# Patient Record
Sex: Female | Born: 2000 | Race: Asian | Hispanic: No | Marital: Single | Smoking: Never smoker
Health system: Southern US, Community
[De-identification: ages and names within clinical notes are randomized; demographics above are authoritative.]

## PROBLEM LIST (undated history)

## (undated) DIAGNOSIS — F419 Anxiety disorder, unspecified: Secondary | ICD-10-CM

## (undated) DIAGNOSIS — F429 Obsessive-compulsive disorder, unspecified: Secondary | ICD-10-CM

---
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
## 2021-09-18 ENCOUNTER — Emergency Department (HOSPITAL_BASED_OUTPATIENT_CLINIC_OR_DEPARTMENT_OTHER)
Admission: EM | Admit: 2021-09-18 | Discharge: 2021-09-18 | Disposition: A | Discharge status: Home / Self Care | Payer: Medicaid Other | Attending: Emergency Medicine | Admitting: Emergency Medicine

## 2021-09-18 ENCOUNTER — Encounter (HOSPITAL_BASED_OUTPATIENT_CLINIC_OR_DEPARTMENT_OTHER): Payer: Self-pay | Admitting: Emergency Medicine

## 2021-09-18 DIAGNOSIS — F840 Autistic disorder: Secondary | ICD-10-CM | POA: Insufficient documentation

## 2021-09-18 DIAGNOSIS — K625 Hemorrhage of anus and rectum: Secondary | ICD-10-CM | POA: Diagnosis present

## 2021-09-18 DIAGNOSIS — K640 First degree hemorrhoids: Secondary | ICD-10-CM | POA: Insufficient documentation

## 2021-09-18 HISTORY — DX: Obsessive-compulsive disorder, unspecified: F42.9

## 2021-09-18 HISTORY — DX: Anxiety disorder, unspecified: F41.9

## 2021-09-18 LAB — COMPREHENSIVE METABOLIC PANEL
ALT: 14 U/L (ref 0–44)
AST: 21 U/L (ref 15–41)
Albumin: 4.4 g/dL (ref 3.5–5.0)
Alkaline Phosphatase: 75 U/L (ref 38–126)
Anion gap: 8 (ref 5–15)
BUN: 14 mg/dL (ref 6–20)
CO2: 25 mmol/L (ref 22–32)
Calcium: 9.8 mg/dL (ref 8.9–10.3)
Chloride: 105 mmol/L (ref 98–111)
Creatinine, Ser: 0.68 mg/dL (ref 0.44–1.00)
GFR, Estimated: >60 mL/min (ref >60)
Glucose, Bld: 85 mg/dL (ref 70–99)
Potassium: 3.9 mmol/L (ref 3.5–5.1)
Sodium: 138 mmol/L (ref 135–145)
Total Bilirubin: 0.7 mg/dL (ref 0.3–1.2)
Total Protein: 8.2 g/dL — ABNORMAL HIGH (ref 6.5–8.1)

## 2021-09-18 LAB — OCCULT BLOOD X 1 CARD TO LAB, STOOL: Fecal Occult Bld: POSITIVE — AB

## 2021-09-18 LAB — CBC WITH DIFFERENTIAL/PLATELET
Abs Immature Granulocytes: 0.03 10*3/uL (ref 0.00–0.07)
Basophils Absolute: 0.1 10*3/uL (ref 0.0–0.1)
Basophils Relative: 0 %
Eosinophils Absolute: 0.1 10*3/uL (ref 0.0–0.5)
Eosinophils Relative: 1 %
HCT: 41.5 % (ref 36.0–46.0)
Hemoglobin: 13.9 g/dL (ref 12.0–15.0)
Immature Granulocytes: 0 %
Lymphocytes Relative: 30 %
Lymphs Abs: 3.5 10*3/uL (ref 0.7–4.0)
MCH: 29.4 pg (ref 26.0–34.0)
MCHC: 33.5 g/dL (ref 30.0–36.0)
MCV: 87.7 fL (ref 80.0–100.0)
Monocytes Absolute: 0.9 10*3/uL (ref 0.1–1.0)
Monocytes Relative: 8 %
Neutro Abs: 7.1 10*3/uL (ref 1.7–7.7)
Neutrophils Relative %: 61 %
Platelets: 230 10*3/uL (ref 150–400)
RBC: 4.73 MIL/uL (ref 3.87–5.11)
RDW: 12 % (ref 11.5–15.5)
WBC: 11.7 10*3/uL — ABNORMAL HIGH (ref 4.0–10.5)
nRBC: 0 % (ref 0.0–0.2)

## 2021-09-18 MED ORDER — LORAZEPAM 2 MG/ML IJ SOLN
1.0000 mg | Freq: Once | INTRAMUSCULAR | Status: AC
  Administered 2021-09-18: 1 mg via INTRAVENOUS
  Filled 2021-09-18: qty 1

## 2021-09-18 MED ORDER — HYDROCORTISONE (PERIANAL) 2.5 % EX CREA: 1.0000 | TOPICAL_CREAM | Freq: Two times a day (BID) | CUTANEOUS | 0 refills | Status: AC

## 2021-09-18 MED ORDER — SERTRALINE HCL 50 MG PO TABS: 150.0000 mg | ORAL_TABLET | Freq: Every day | ORAL | 0 refills | Status: AC

## 2021-09-18 NOTE — ED Notes (Signed)
ED Provider at bedside. 

## 2021-09-18 NOTE — ED Triage Notes (Signed)
Mom reports pt has been sticking her finger up her anus x 2 months. Mom states when patient pulls her finger out there is blood and feces. Mom unsure why patient is doing this. On sertraline and gabapentin for anxiety and OCD.

## 2021-09-18 NOTE — Discharge Instructions (Addendum)
You have hemorrhoids and I recommend you use Anusol cream twice daily.  You are expected to still have some bleeding  I have increased your sertraline to 150 mg daily.  You need to keep your appointment with psychiatry as scheduled in July  Return to ER if you have worse agitation, aggressive behavior, uncontrolled bleeding

## 2021-09-18 NOTE — ED Provider Notes (Signed)
[facility] EMERGENCY DEPARTMENT Provider Note   CSN: [id number] Arrival date & time: 09/18/21  1530     History  Chief Complaint  Patient presents with   Rectal Bleeding   Mental Health Problem    Mckenzie Terry is a 21 y.o. female history of OCD and autism here presenting with rectal bleeding and agitation.  Patient is from home and accompanied by her brother and also mother.  Mother states that patient has OCD and is constantly sticking her finger in her rectum.  Patient usually is in the bathroom for hours at a time.  Mother noticed that recently she has a lot of rectal bleeding.  Patient has been following up with psychiatry.  Patient was seen in the ED several months ago for similar symptoms and started on the Abilify.  Patient then saw psychiatry and switched to clonidine, gabapentin, sertraline.  Her sertraline dose was increased to 100 mg from 50 mg about a month ago and the behavior has not improved.  Patient has psychiatry follow-up in 2 weeks.  Since she has more bleeding in her rectum since yesterday, mother wants to get her medically evaluated.  The history is provided by the patient.       Home Medications Prior to Admission medications   Medication Sig Start Date End Date Taking? Authorizing Provider  ARIPiprazole (ABILIFY) 5 MG tablet Take 1 tablet (5 mg total) by mouth in the morning and at bedtime. 10/18/20 11/17/20  Linwood Dibbles, MD  fluvoxaMINE (LUVOX) 50 MG tablet Take 50 mg by mouth at bedtime.    [provider]  MELATONIN PO Take 1 tablet by mouth as needed (for sleep).    [provider]  Multiple Vitamin (MULTIVITAMIN PO) Take 1 tablet by mouth daily.    [provider]      Allergies    Patient has no known allergies.    Review of Systems   Review of Systems  Gastrointestinal:  Positive for blood in stool.  All other systems reviewed and are negative.   Physical Exam Updated Vital Signs BP 109/77   Pulse 95    Temp 98.6 F (37 C) (Oral)   Resp 18   SpO2 94%  Physical Exam Vitals and nursing note reviewed.  Constitutional:      Comments: Autistic  HENT:     Head: Normocephalic.     Nose: Nose normal.     Mouth/Throat:     Mouth: Mucous membranes are moist.  Eyes:     Extraocular Movements: Extraocular movements intact.     Pupils: Pupils are equal, round, and reactive to light.  Cardiovascular:     Rate and Rhythm: Normal rate and regular rhythm.     Pulses: Normal pulses.     Heart sounds: Normal heart sounds.  Pulmonary:     Effort: Pulmonary effort is normal.     Breath sounds: Normal breath sounds.  Abdominal:     General: Abdomen is flat.     Palpations: Abdomen is soft.  Genitourinary:    Comments: Rectal-multiple external hemorrhoids.  No obvious thrombosis.  Patient has minimal blood on the rectal exam and no stool impaction Musculoskeletal:        General: Normal range of motion.     Cervical back: Normal range of motion and neck supple.  Skin:    General: Skin is warm.     Capillary Refill: Capillary refill takes less than 2 seconds.  Neurological:     General:  No focal deficit present.     Mental Status: She is alert and oriented to person, place, and time.  Psychiatric:        Mood and Affect: Mood normal.        Behavior: Behavior normal.     ED Results / Procedures / Treatments   Labs (all labs ordered are listed, but only abnormal results are displayed) Labs Reviewed  CBC WITH DIFFERENTIAL/PLATELET - Abnormal; Notable for the following components:      Result Value   WBC 11.7 (*)    All other components within normal limits  COMPREHENSIVE METABOLIC PANEL - Abnormal; Notable for the following components:   Total Protein 8.2 (*)    All other components within normal limits  OCCULT BLOOD X 1 CARD TO LAB, STOOL - Abnormal; Notable for the following components:   Fecal Occult Bld POSITIVE (*)    All other components within normal limits  URINALYSIS,  ROUTINE W REFLEX MICROSCOPIC  PREGNANCY, URINE  RAPID URINE DRUG SCREEN, HOSP PERFORMED    EKG None  Radiology No results found.  Procedures Procedures    Medications Ordered in ED Medications  LORazepam (ATIVAN) injection 1 mg (1 mg Intravenous Given 09/18/21 1939)    ED Course/ Medical Decision Making/ A&P                           Medical Decision Making Mckenzie Terry is a 21 y.o. female here presenting with rectal bleeding.  Patient put her finger in her rectum constantly due to her OCD.  She has some external hemorrhoids that is likely the source of her bleeding.  Patient is already on sertraline and gabapentin and has psychiatry follow-up.  Patient is not suicidal and appears calm in the ER does not eat emergent psych eval.  Plan to get CBC and CMP and will reassess  8:15 PM I reviewed patient's labs.  Hemoglobin is 13.9.  Guaiac is positive which I expect from her external hemorrhoids.  Had a long discussion with mother and brother.  I think at this point, patient already has follow-up with psychiatry does not need to be hospitalized for medical or psychiatric reasons.  She has been on sertraline for the last several months and does was increased about a month ago.  Mother was wondering about increasing her dose further.  Her follow-up with psychiatry is next month.  I will increase her sertraline to 150 mg daily from 100 mg daily.  We will give Anusol cream for hemorrhoids. Gave strict return precautions.  Problems Addressed: Autism: chronic illness or injury Grade I hemorrhoids: acute illness or injury  Amount and/or Complexity of Data Reviewed Labs: ordered. Decision-making details documented in ED Course.  Risk Prescription drug management.    Final Clinical Impression(s) / ED Diagnoses Final diagnoses:  Grade I hemorrhoids  Autism    Rx / DC Orders ED Discharge Orders     None         Charlynne Pander, MD 09/18/21 2017

## 2021-09-18 NOTE — ED Notes (Signed)
Patient given warm blanket for comfort.

## 2022-10-03 DIAGNOSIS — K5901 Slow transit constipation: Secondary | ICD-10-CM | POA: Insufficient documentation

## 2023-02-13 DIAGNOSIS — R5382 Chronic fatigue, unspecified: Secondary | ICD-10-CM | POA: Insufficient documentation

## 2023-07-02 ENCOUNTER — Telehealth (HOSPITAL_COMMUNITY): Payer: Self-pay | Admitting: Pediatrics

## 2023-07-02 NOTE — Telephone Encounter (Signed)
 11:15am 07/02/23 Pt's father returned the call gave the referrals for Autism services.  [facility] # [phone]  [facility] Therapy ([facility]) [phone]  [facility]  [phone]  The father was appreciative of the information./sh

## 2023-11-05 ENCOUNTER — Ambulatory Visit: Payer: MEDICAID | Admitting: Nurse Practitioner

## 2023-11-05 ENCOUNTER — Encounter: Payer: Self-pay | Admitting: Nurse Practitioner

## 2023-11-05 VITALS — BP 120/80 | HR 72 | Temp 97.9°F | Wt 123.2 lb

## 2023-11-05 DIAGNOSIS — F331 Major depressive disorder, recurrent, moderate: Secondary | ICD-10-CM

## 2023-11-05 DIAGNOSIS — F419 Anxiety disorder, unspecified: Secondary | ICD-10-CM | POA: Diagnosis not present

## 2023-11-05 DIAGNOSIS — F840 Autistic disorder: Secondary | ICD-10-CM

## 2023-11-05 DIAGNOSIS — F909 Attention-deficit hyperactivity disorder, unspecified type: Secondary | ICD-10-CM

## 2023-11-19 ENCOUNTER — Ambulatory Visit: Payer: MEDICAID | Admitting: Nurse Practitioner

## 2023-11-20 ENCOUNTER — Ambulatory Visit: Payer: MEDICAID | Admitting: Nurse Practitioner

## 2023-11-20 DIAGNOSIS — F331 Major depressive disorder, recurrent, moderate: Secondary | ICD-10-CM

## 2023-11-20 DIAGNOSIS — F840 Autistic disorder: Secondary | ICD-10-CM | POA: Diagnosis not present

## 2023-11-20 DIAGNOSIS — F900 Attention-deficit hyperactivity disorder, predominantly inattentive type: Secondary | ICD-10-CM

## 2023-11-20 DIAGNOSIS — F419 Anxiety disorder, unspecified: Secondary | ICD-10-CM | POA: Diagnosis not present

## 2023-12-01 ENCOUNTER — Ambulatory Visit: Payer: MEDICAID | Admitting: Nurse Practitioner

## 2023-12-01 NOTE — Progress Notes (Unsigned)
   Subjective:  Initial Psychiatric Evaluation and Medication Mangement    HPI: Mckenzie Terry is a 23 y.o. female presenting on 11/05/2023 to establish psychiatric care  accompanied by her mother who is her guardian. She is a previous patient of this provider at a previous practice. Patient carries a diagnosis of Autism, depression, anxiety and OCD.     ROS: Negative unless specifically indicated above in HPI.   Relevant past medical history reviewed and updated as indicated.   Allergies and medications reviewed and updated.   Current Outpatient Medications  Medication Instructions  . ARIPiprazole  (ABILIFY ) 5 mg, Oral, 2 times daily  . fluvoxaMINE (LUVOX) 50 mg, Oral, Daily at bedtime  . hydrocortisone  (ANUSOL -HC) 2.5 % rectal cream 1 Application, Rectal, 2 times daily  . MELATONIN PO 1 tablet, Oral, As needed  . Multiple Vitamin (MULTIVITAMIN PO) 1 tablet, Oral, Daily  . sertraline  (ZOLOFT ) 150 mg, Oral, Daily     No Known Allergies  Objective:   BP 120/80 (BP Location: Right Arm, Patient Position: Sitting, Cuff Size: Normal)   Pulse 72   Temp 97.9 F (36.6 C) (Tympanic)   Wt 123 lb 3.2 oz (55.9 kg)    Physical Exam Constitutional:      Appearance: Normal appearance.  HENT:     Head: Normocephalic.  Eyes:     Conjunctiva/sclera: Conjunctivae normal.  Cardiovascular:     Rate and Rhythm: Normal rate and regular rhythm.  Pulmonary:     Effort: Pulmonary effort is normal.     Breath sounds: Normal breath sounds.  Skin:    General: Skin is warm and dry.  Neurological:     General: No focal deficit present.     Mental Status: She is alert and oriented to person, place, and time.  Psychiatric:        Mood and Affect: Mood normal.        Behavior: Behavior normal.        Thought Content: Thought content normal.        Judgment: Judgment normal.     Eye Contact:  {BHH EYE CONTACT:22684}  Speech:  {Speech:22685}  Volume:  {Volume (PAA):22686}  Mood:   {BHH MOOD:22306}  Affect:  {Affect (PAA):22687}  Thought Process:  {Thought Process (PAA):22688}  Orientation:  {BHH ORIENTATION (PAA):22689}  Thought Content:  {Thought Content:22690}  Suicidal Thoughts:  {ST/HT (PAA):22692}  Homicidal Thoughts:  {ST/HT (PAA):22692}  Memory:  {BHH MEMORY:22881}  Judgement:  {Judgement (PAA):22694}  Insight:  {Insight (PAA):22695}  Psychomotor Activity:  {Psychomotor (PAA):22696}  Concentration:  {Concentration:21399}  Recall:  {BHH GOOD/FAIR/POOR:22877}  Fund of Knowledge:  {BHH GOOD/FAIR/POOR:22877}  Language:  {BHH GOOD/FAIR/POOR:22877}  Akathisia:  {BHH YES OR NO:22294}  Handed:  {Handed:22697}  AIMS (if indicated):     Assets:  {Assets (PAA):22698}  ADL's:  {BHH JIO'D:77709}  Cognition:  {chl bhh cognition:304700322}  Sleep:        Assessment & Plan:   Assessment & Plan     Follow up plan: No follow-ups on file.  Florencia Cousin, NP

## 2023-12-06 DIAGNOSIS — F331 Major depressive disorder, recurrent, moderate: Secondary | ICD-10-CM | POA: Insufficient documentation

## 2023-12-06 DIAGNOSIS — F909 Attention-deficit hyperactivity disorder, unspecified type: Secondary | ICD-10-CM | POA: Insufficient documentation

## 2023-12-06 DIAGNOSIS — F419 Anxiety disorder, unspecified: Secondary | ICD-10-CM | POA: Insufficient documentation

## 2023-12-06 NOTE — Assessment & Plan Note (Addendum)
 Continue Trazodone 50mg 

## 2023-12-06 NOTE — Assessment & Plan Note (Addendum)
 Continue Fluoxetine 40 mg PO QAM

## 2023-12-06 NOTE — Assessment & Plan Note (Addendum)
 Change Guanfacine Er 2 mg to Guanfacine 2 mg

## 2023-12-06 NOTE — Assessment & Plan Note (Addendum)
 Continue Abilify  10 mg

## 2023-12-08 DIAGNOSIS — F840 Autistic disorder: Secondary | ICD-10-CM | POA: Insufficient documentation

## 2023-12-08 NOTE — Assessment & Plan Note (Addendum)
 Continue Abilify  10 mg PO daily for aggression/mood  [facility]ends [employer] [facility]

## 2023-12-08 NOTE — Progress Notes (Signed)
 Subjective:  She's about the same. They never came and started the morning therapy sessions to get her ready for the [facility] like we talked about.    HPI: Mckenzie Terry is a 23 y.o. female presenting on 11/20/2023 in office accompanied by her mother. Mom expresses disappointment that patient has not started any of the things that we talked abut with the case manager last visit, about a caregiver coming out to the house every morning to start her structured routine that we talked about last visit with getting ready for school and not staying in the bathroom for hours every morning and either missing  or going late to the day center.    Mom reports she does remain less aggressive and isn't  hitting like she used to but has her episodes. Mom reports she is never aggressive to anyone at the center or out in public. However, she gets upset when she doesn't get her way with mom and her brother.  Mom denies observing poor appetite but she doesn't eat much and she doesn't like to drink water which causes her constipation. She has issues with sleep but sleeps better when she takes the Guanfacine and Trazodone but doesn't want to take it because she knows it makes her sleepy. Denies psychosis, delusions, suicidal or homicidal ideations.      ROS: Negative unless specifically indicated above in HPI.   Relevant past medical history reviewed and updated as indicated.   Allergies and medications reviewed and updated.   Current Outpatient Medications  Medication Instructions   ARIPiprazole  (ABILIFY ) 5 mg, Oral, 2 times daily   fluvoxaMINE (LUVOX) 50 mg, Oral, Daily at bedtime   hydrocortisone  (ANUSOL -HC) 2.5 % rectal cream 1 Application, Rectal, 2 times daily   MELATONIN PO 1 tablet, Oral, As needed   Multiple Vitamin (MULTIVITAMIN PO) 1 tablet, Oral, Daily   sertraline  (ZOLOFT ) 150 mg, Oral, Daily     No Known Allergies  Objective:   There were no vitals taken for  this visit.   Physical Exam Constitutional:      Appearance: Normal appearance.  HENT:     Head: Normocephalic.  Eyes:     Conjunctiva/sclera: Conjunctivae normal.  Cardiovascular:     Rate and Rhythm: Normal rate and regular rhythm.  Pulmonary:     Effort: Pulmonary effort is normal.     Breath sounds: Normal breath sounds.  Skin:    General: Skin is warm and dry.  Neurological:     General: No focal deficit present.     Mental Status: She is alert and oriented to person, place, and time.  Psychiatric:        Attention and Perception: Attention and perception normal. She is attentive. She does not perceive auditory or visual hallucinations.        Mood and Affect: Mood and affect normal.        Speech: Speech is not delayed.        Behavior: Behavior normal. Behavior is cooperative.      Assessment & Plan:   Assessment & Plan Attention deficit hyperactivity disorder (ADHD), predominantly inattentive type Continue Guanfacine 2 mg PO every evening      Moderate episode of recurrent major depressive disorder (HCC) Continue Fluoxetine 40 mg PO QAM     Anxiety Continue Trazodone 50 mg PO QHS     Autistic disorder Continue Abilify  10 mg PO daily for aggression/mood  [facility]  Follow up plan: Return in about 4 weeks (around 12/18/2023) for Medication Follow-up.  Florencia Cousin, NP

## 2023-12-08 NOTE — Assessment & Plan Note (Addendum)
 Continue Guanfacine 2 mg PO every evening

## 2023-12-08 NOTE — Assessment & Plan Note (Addendum)
 Continue Fluoxetine 40 mg PO QAM

## 2023-12-08 NOTE — Assessment & Plan Note (Addendum)
 Continue Trazodone 50 mg PO QHS

## 2024-01-12 ENCOUNTER — Ambulatory Visit: Payer: MEDICAID | Admitting: Nurse Practitioner

## 2024-02-02 ENCOUNTER — Ambulatory Visit: Payer: MEDICAID | Admitting: Nurse Practitioner

## 2024-02-02 ENCOUNTER — Encounter: Payer: Self-pay | Admitting: Nurse Practitioner

## 2024-02-02 NOTE — Progress Notes (Unsigned)
   Subjective:      HPI: Mckenzie Terry is a 23 y.o. female presenting on 02/02/2024 for psychiatry follow up accompanied by her mother, [name].  I spoke with Lashwana   24 hours of CLS group attend [facility] program 4 hours CLS indivdial  [facility]  Monday and Friday 9:00 AM-12:00 PM Tuesday, Wednesday and Thursday 10:00 AM-1:00 PM   [facility] Director: Wynelle Abate [phone] Care Manager: Latterrius [phone] Medical Coordinator: Apolinar [phone]  ROS: Negative unless specifically indicated above in HPI.   Relevant past medical history reviewed and updated as indicated.   Allergies and medications reviewed and updated.   Current Outpatient Medications  Medication Instructions  . ARIPiprazole  (ABILIFY ) 5 mg, Oral, 2 times daily  . fluvoxaMINE (LUVOX) 50 mg, Oral, Daily at bedtime  . hydrocortisone  (ANUSOL -HC) 2.5 % rectal cream 1 Application, Rectal, 2 times daily  . MELATONIN PO 1 tablet, Oral, As needed  . Multiple Vitamin (MULTIVITAMIN PO) 1 tablet, Oral, Daily  . sertraline  (ZOLOFT ) 150 mg, Oral, Daily     No Known Allergies  Objective:   BP 102/80 (BP Location: Right Arm, Patient Position: Sitting, Cuff Size: Normal)   Pulse 88   Temp 98 F (36.7 C) (Temporal)   Resp 18   Wt 126 lb 12.8 oz (57.5 kg)   SpO2 98%    Physical Exam Constitutional:      Appearance: Normal appearance.  HENT:     Head: Normocephalic.  Eyes:     Conjunctiva/sclera: Conjunctivae normal.  Cardiovascular:     Rate and Rhythm: Normal rate and regular rhythm.  Pulmonary:     Effort: Pulmonary effort is normal.     Breath sounds: Normal breath sounds.  Skin:    General: Skin is warm and dry.  Neurological:     General: No focal deficit present.     Mental Status: She is alert and oriented to person, place, and time.  Psychiatric:        Mood and Affect: Mood normal.        Behavior: Behavior normal.        Thought Content: Thought content  normal.        Judgment: Judgment normal.     Eye Contact:  {BHH EYE CONTACT:22684}  Speech:  {Speech:22685}  Volume:  {Volume (PAA):22686}  Mood:  {BHH MOOD:22306}  Affect:  {Affect (PAA):22687}  Thought Process:  {Thought Process (PAA):22688}  Orientation:  {BHH ORIENTATION (PAA):22689}  Thought Content:  {Thought Content:22690}  Suicidal Thoughts:  {ST/HT (PAA):22692}  Homicidal Thoughts:  {ST/HT (PAA):22692}  Memory:  {BHH MEMORY:22881}  Judgement:  {Judgement (PAA):22694}  Insight:  {Insight (PAA):22695}  Psychomotor Activity:  {Psychomotor (PAA):22696}  Concentration:  {Concentration:21399}  Recall:  {BHH GOOD/FAIR/POOR:22877}  Fund of Knowledge:  {BHH GOOD/FAIR/POOR:22877}  Language:  {BHH GOOD/FAIR/POOR:22877}  Akathisia:  {BHH YES OR NO:22294}  Handed:  {Handed:22697}  AIMS (if indicated):     Assets:  {Assets (PAA):22698}  ADL's:  {BHH JIO'D:77709}  Cognition:  {chl bhh cognition:304700322}  Sleep:        Assessment & Plan:   Assessment & Plan     Follow up plan: No follow-ups on file.  Florencia Cousin, NP

## 2024-03-03 ENCOUNTER — Encounter: Payer: Self-pay | Admitting: Nurse Practitioner

## 2024-03-03 ENCOUNTER — Ambulatory Visit: Payer: MEDICAID | Admitting: Nurse Practitioner

## 2024-03-03 VITALS — BP 108/82 | HR 78 | Resp 22 | Ht 66.0 in | Wt 130.2 lb

## 2024-03-03 DIAGNOSIS — F331 Major depressive disorder, recurrent, moderate: Secondary | ICD-10-CM

## 2024-03-03 DIAGNOSIS — F419 Anxiety disorder, unspecified: Secondary | ICD-10-CM

## 2024-03-03 DIAGNOSIS — F840 Autistic disorder: Secondary | ICD-10-CM

## 2024-03-03 DIAGNOSIS — F422 Mixed obsessional thoughts and acts: Secondary | ICD-10-CM

## 2024-03-03 NOTE — Progress Notes (Unsigned)
" ° °  Subjective:  She's gotten more obsessive and aggressive.    HPI: Mckenzie Terry is a 23 y.o. female presenting on 03/03/2024   ROS: Negative unless specifically indicated above in HPI.   Relevant past medical history reviewed and updated as indicated.   Allergies and medications reviewed and updated.   Current Outpatient Medications  Medication Instructions   ARIPiprazole  (ABILIFY ) 5 mg, Oral, 2 times daily   fluvoxaMINE (LUVOX) 50 mg, Daily at bedtime   guanFACINE (INTUNIV) 2 mg, Daily at bedtime   hydrocortisone  (ANUSOL -HC) 2.5 % rectal cream 1 Application, Rectal, 2 times daily   MELATONIN PO 1 tablet, As needed   Multiple Vitamin (MULTIVITAMIN PO) 1 tablet, Daily   sertraline  (ZOLOFT ) 150 mg, Oral, Daily     Allergies[1]  Objective:   BP 108/82 (BP Location: Right Arm, Patient Position: Sitting, Cuff Size: Normal)   Pulse 78   Resp (!) 22   Ht 5' 6 (1.676 m)   Wt 130 lb 3.2 oz (59.1 kg)   SpO2 99%   BMI 21.01 kg/m    Physical Exam Constitutional:      Appearance: Normal appearance.  HENT:     Head: Normocephalic.  Eyes:     Conjunctiva/sclera: Conjunctivae normal.  Cardiovascular:     Rate and Rhythm: Normal rate and regular rhythm.  Pulmonary:     Effort: Pulmonary effort is normal.     Breath sounds: Normal breath sounds.  Skin:    General: Skin is warm and dry.  Neurological:     General: No focal deficit present.     Mental Status: She is alert and oriented to person, place, and time.  Psychiatric:        Mood and Affect: Mood normal.        Behavior: Behavior normal.        Thought Content: Thought content normal.        Judgment: Judgment normal.      Assessment & Plan:   Assessment & Plan Autistic disorder     Mixed obsessional thoughts and acts     Anxiety     Moderate episode of recurrent major depressive disorder (HCC)     Continue Abilify  5 mg po at bedtime for agression/mood Continue Zoloft  50 mg po  daily for anxiety/depression   Follow up plan: No follow-ups on file.  Florencia Cousin, NP        [1] No Known Allergies "

## 2024-03-31 ENCOUNTER — Ambulatory Visit: Payer: MEDICAID | Admitting: Nurse Practitioner

## 2024-03-31 ENCOUNTER — Other Ambulatory Visit: Payer: Self-pay

## 2024-03-31 ENCOUNTER — Encounter: Payer: Self-pay | Admitting: Nurse Practitioner

## 2024-03-31 VITALS — BP 128/72 | HR 84 | Resp 18 | Wt 130.4 lb

## 2024-03-31 DIAGNOSIS — F419 Anxiety disorder, unspecified: Secondary | ICD-10-CM | POA: Diagnosis not present

## 2024-03-31 DIAGNOSIS — F422 Mixed obsessional thoughts and acts: Secondary | ICD-10-CM | POA: Insufficient documentation

## 2024-03-31 DIAGNOSIS — F840 Autistic disorder: Secondary | ICD-10-CM

## 2024-03-31 DIAGNOSIS — F331 Major depressive disorder, recurrent, moderate: Secondary | ICD-10-CM

## 2024-03-31 DIAGNOSIS — F70 Mild intellectual disabilities: Secondary | ICD-10-CM | POA: Diagnosis not present

## 2024-03-31 NOTE — Assessment & Plan Note (Signed)
 [name]

## 2024-03-31 NOTE — Assessment & Plan Note (Signed)
 Mckenzie Terry

## 2024-03-31 NOTE — Progress Notes (Signed)
 "  Subjective:  She's leaving on Monday to go to [facility].    HPI: Mckenzie Terry is a 24 y.o. female presenting on 03/31/2024 in office accompanied by er mother for medication follow up  Mom reports patient has been doing better about taking her medication and not refusing. We agreed to continue the Abilify  5 mg at bedtime for aggression/mood and hold on to the Abilify  Maintena injection. Mom reports patient is doing a lot better on the Zoloft  and has not c/o or observed any negative side effects such as dizziness like she was on the Prozac. She is no longer on the Guanfacine but sleeping better.   Mom reports patient continues to display obsessive compulsive behaviors and daily bathroom routines such as brushing her teeth, hair, showering, etc. for 5 hours in the morning and the evening.   During our session, patient's Medicaid care coordinator, ,Apolinar, and I discussed expectations and goals that patient will be working on to help with her ADL's at [facility]. Mom is nervious but looking forward to her daughter going there as she would like her to be able to functio in society the way she was when she was in high school.   Mom reports improvement with appetite and sleep and sleeping well for the most part. She denies observing psychosis, delusions suicidal or homicidal ideations.    ROS: Negative unless specifically indicated above in HPI.   Relevant past medical history reviewed and updated as indicated.   Allergies and medications reviewed and updated.   Current Outpatient Medications  Medication Instructions   Abilify  Maintena 300 mg, Every 28 days   fluvoxaMINE (LUVOX) 50 mg, Daily at bedtime   guanFACINE (INTUNIV) 2 mg, Daily at bedtime   hydrocortisone  (ANUSOL -HC) 2.5 % rectal cream 1 Application, Rectal, 2 times daily   MELATONIN PO 1 tablet, As needed   Multiple Vitamin (MULTIVITAMIN PO) 1 tablet, Daily   sertraline  (ZOLOFT ) 150 mg, Oral, Daily      Allergies[1]  Objective:   BP 128/72 (BP Location: Right Leg, Patient Position: Sitting, Cuff Size: Normal)   Pulse 84   Resp 18   Wt 130 lb 6.4 oz (59.1 kg)   SpO2 99%   BMI 21.05 kg/m    Physical Exam Constitutional:      Appearance: Normal appearance.  HENT:     Head: Normocephalic.  Eyes:     Conjunctiva/sclera: Conjunctivae normal.  Cardiovascular:     Rate and Rhythm: Normal rate and regular rhythm.  Pulmonary:     Effort: Pulmonary effort is normal.     Breath sounds: Normal breath sounds.  Skin:    General: Skin is warm and dry.  Neurological:     General: No focal deficit present.     Mental Status: She is alert and oriented to person, place, and time.  Psychiatric:        Attention and Perception: She is inattentive.        Mood and Affect: Mood normal.        Speech: Speech is delayed.        Behavior: Behavior is slowed. Behavior is cooperative.        Thought Content: Thought content is paranoid.        Cognition and Memory: Cognition is impaired.        Judgment: Judgment is inappropriate.     Assessment & Plan:   Assessment & Plan Autism disorder     Intellectual developmental disorder, mild  Moderate episode of recurrent major depressive disorder (HCC)     Anxiety     Mixed obsessional thoughts and acts     Continue Zoloft  50 mg po daily for depressin/anxiety Continue Abilify  5 mg po at bedtime for agressio/mood Hold Abilify  Maintena 300 mg injection for now Patient will be leaving for [facility] on 04/04/2024 and provider will participate in weekly update/discharging planninf on Fridays until patient is ready to discharge  Follow up plan: Return in about 2 months (around 05/29/2024).  Florencia Cousin, NP       [1] No Known Allergies  "

## 2024-04-12 NOTE — Assessment & Plan Note (Signed)
 [name]

## 2024-04-12 NOTE — Assessment & Plan Note (Signed)
 Mckenzie Terry

## 2024-04-19 ENCOUNTER — Telehealth: Payer: MEDICAID | Admitting: Nurse Practitioner

## 2024-04-19 DIAGNOSIS — F840 Autistic disorder: Secondary | ICD-10-CM

## 2024-04-19 DIAGNOSIS — F419 Anxiety disorder, unspecified: Secondary | ICD-10-CM

## 2024-04-19 DIAGNOSIS — F330 Major depressive disorder, recurrent, mild: Secondary | ICD-10-CM | POA: Diagnosis not present

## 2024-04-19 DIAGNOSIS — B3731 Acute candidiasis of vulva and vagina: Secondary | ICD-10-CM

## 2024-04-19 NOTE — Progress Notes (Unsigned)
" ° °  Subjective:  She's been having vaginal itching and white discharge.    HPI: Mckenzie Terry is a 24 y.o. female presenting on 04/19/2024 via telehealth in presence of her mother.  Patient has been doing well at [facility]. She came home on Friday and will be going back on Wednesday. This provider has been attending patient's ready for discharge meetings on Mondays and had two meeting thus far about her progress, strengths and things she needs to still work on.   During the meeting the nurse mentioned that she has been taking a shower with her underclothes on and was concerned about a yeast infection due to staying in her wet undergarment during and after her shower. The nurse did mention during the meeting that she has been putting her heads in her vaginal area and noticed her being uncomfortable down there. She also mentioned she has been trying very hard to follow guidance and rules of the facility and noticed some increased anxiety due to the new environment. Mom reports patient has white discharge in her vaginal area. We discussed treating her for yeast infection with Diflucan and increasing her Zoloft  dose to 75 mg. [facility] nurse was notified of this treatment.   ROS: Negative unless specifically indicated above in HPI.   Relevant past medical history reviewed and updated as indicated.   Allergies and medications reviewed and updated.   Current Outpatient Medications  Medication Instructions   Abilify  Maintena 300 mg, Every 28 days   fluvoxaMINE (LUVOX) 50 mg, Daily at bedtime   guanFACINE (INTUNIV) 2 mg, Daily at bedtime   hydrocortisone  (ANUSOL -HC) 2.5 % rectal cream 1 Application, Rectal, 2 times daily   MELATONIN PO 1 tablet, As needed   Multiple Vitamin (MULTIVITAMIN PO) 1 tablet, Daily   sertraline  (ZOLOFT ) 150 mg, Oral, Daily     Allergies[1]  Objective:   There were no vitals taken for this visit.   Physical Exam Constitutional:      Appearance: Normal  appearance.  HENT:     Head: Normocephalic.  Eyes:     Conjunctiva/sclera: Conjunctivae normal.  Skin:    General: Skin is warm and dry.  Neurological:     General: No focal deficit present.     Mental Status: She is alert and oriented to person, place, and time.  Psychiatric:        Mood and Affect: Mood is anxious.        Behavior: Behavior normal.        Thought Content: Thought content normal.        Judgment: Judgment normal.    Assessment & Plan:   Assessment & Plan Mild episode of recurrent major depressive disorder     Anxiety     Yeast infection of the vagina     Autism disorder     Start Diflucan 150 mg po once as a one time dose Increase Zoloft  50 mg to 75 mg po daily for depression/anxiety Continue Abilify  5 mg po at bedtime for aggression/agitation   Follow up plan: Return in about 4 weeks (around 05/17/2024) for Medication Follow-up.  Florencia Cousin, NP        [1] No Known Allergies  "

## 2024-04-19 NOTE — Progress Notes (Incomplete)
" ° °  Subjective:  She's been having vaginal itching and white discharge.    HPI: Mckenzie Terry is a 24 y.o. female presenting on 04/19/2024 via telehealth in presence of her mother.  Patient has been doing well at [facility]. She came home on Friday and will be doing back on Wednesday. This provider has been attending patient's ready for discharge meetings on Mondays and had two meeting this far about her progress, strengths and things she needed to still work on. During the meeting it was noted that she has been taking a shower with her underclothes on and was concerned about a yeast infection due to staying in her wet undergarment during and after her shower. The nurse did mention during the meeting that she has been putting her heads in her vaginal area and noticed her being uncomfortable down there. She also mentioned she has been trying very hard to follow guidance and rules of the facility and noticed some increased anxiety due to   ROS: Negative unless specifically indicated above in HPI.   Relevant past medical history reviewed and updated as indicated.   Allergies and medications reviewed and updated.   Current Outpatient Medications  Medication Instructions   Abilify  Maintena 300 mg, Every 28 days   fluvoxaMINE (LUVOX) 50 mg, Daily at bedtime   guanFACINE (INTUNIV) 2 mg, Daily at bedtime   hydrocortisone  (ANUSOL -HC) 2.5 % rectal cream 1 Application, Rectal, 2 times daily   MELATONIN PO 1 tablet, As needed   Multiple Vitamin (MULTIVITAMIN PO) 1 tablet, Daily   sertraline  (ZOLOFT ) 150 mg, Oral, Daily     Allergies[1]  Objective:   There were no vitals taken for this visit.   Physical Exam Constitutional:      Appearance: Normal appearance.  HENT:     Head: Normocephalic.  Eyes:     Conjunctiva/sclera: Conjunctivae normal.  Cardiovascular:     Rate and Rhythm: Normal rate and regular rhythm.  Pulmonary:     Effort: Pulmonary effort is normal.     Breath  sounds: Normal breath sounds.  Skin:    General: Skin is warm and dry.  Neurological:     General: No focal deficit present.     Mental Status: She is alert and oriented to person, place, and time.  Psychiatric:        Mood and Affect: Mood normal.        Behavior: Behavior normal.        Thought Content: Thought content normal.        Judgment: Judgment normal.    Assessment & Plan:   Assessment & Plan     Follow up plan: No follow-ups on file.  Florencia Cousin, NP         [1] No Known Allergies "

## 2024-04-20 DIAGNOSIS — B3731 Acute candidiasis of vulva and vagina: Secondary | ICD-10-CM | POA: Insufficient documentation

## 2024-04-20 DIAGNOSIS — F330 Major depressive disorder, recurrent, mild: Secondary | ICD-10-CM | POA: Insufficient documentation

## 2024-04-20 NOTE — Assessment & Plan Note (Signed)
 Mckenzie Terry

## 2024-04-20 NOTE — Assessment & Plan Note (Signed)
 [name]

## 2024-05-24 ENCOUNTER — Ambulatory Visit: Payer: MEDICAID | Admitting: Nurse Practitioner
